# Patient Record
Sex: Female | Born: 1955 | Race: Asian | Hispanic: No | Smoking: Never smoker
Health system: Southern US, Community
[De-identification: ages and names within clinical notes are randomized; demographics above are authoritative.]

## PROBLEM LIST (undated history)

## (undated) DIAGNOSIS — E119 Type 2 diabetes mellitus without complications: Secondary | ICD-10-CM

## (undated) DIAGNOSIS — R040 Epistaxis: Secondary | ICD-10-CM

## (undated) DIAGNOSIS — E7800 Pure hypercholesterolemia, unspecified: Secondary | ICD-10-CM

## (undated) DIAGNOSIS — I10 Essential (primary) hypertension: Secondary | ICD-10-CM

---

## 2003-08-02 ENCOUNTER — Emergency Department (HOSPITAL_COMMUNITY): Admission: EM | Admit: 2003-08-02 | Discharge: 2003-08-02 | Payer: Self-pay | Admitting: *Deleted

## 2003-08-05 ENCOUNTER — Emergency Department (HOSPITAL_COMMUNITY): Admission: EM | Admit: 2003-08-05 | Discharge: 2003-08-05 | Payer: Self-pay | Admitting: Emergency Medicine

## 2007-03-25 ENCOUNTER — Encounter: Admission: RE | Admit: 2007-03-25 | Discharge: 2007-03-25 | Payer: Self-pay | Admitting: Obstetrics and Gynecology

## 2007-10-07 ENCOUNTER — Encounter: Admission: RE | Admit: 2007-10-07 | Discharge: 2007-10-07 | Payer: Self-pay | Admitting: Gastroenterology

## 2009-09-12 ENCOUNTER — Encounter: Admission: RE | Admit: 2009-09-12 | Discharge: 2009-09-12 | Payer: Self-pay | Admitting: Family Medicine

## 2009-09-12 IMAGING — MG MM DIGITAL SCREENING
4 series · 4 of 4 positions shown · non-contrast
Comparison: none

DG SCREEN MAMMOGRAM BILATERAL
Bilateral CC and MLO view(s) were taken.

DIGITAL SCREENING MAMMOGRAM WITH CAD:
There are scattered fibroglandular densities.  No masses or malignant type calcifications are 
identified.  Compared with prior studies.
Images were processed with CAD.

[R CC]
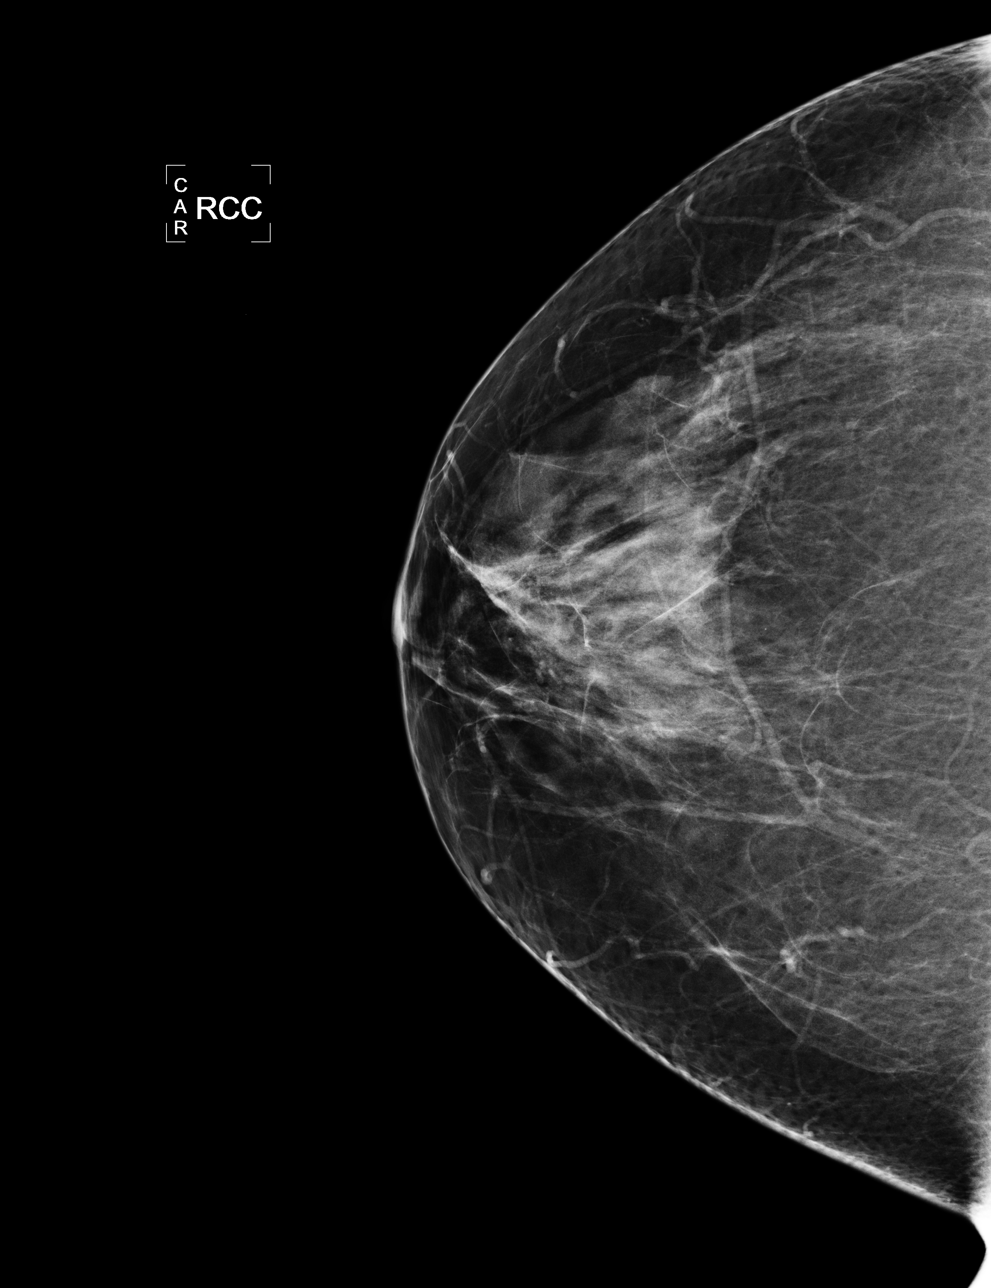

[L CC]
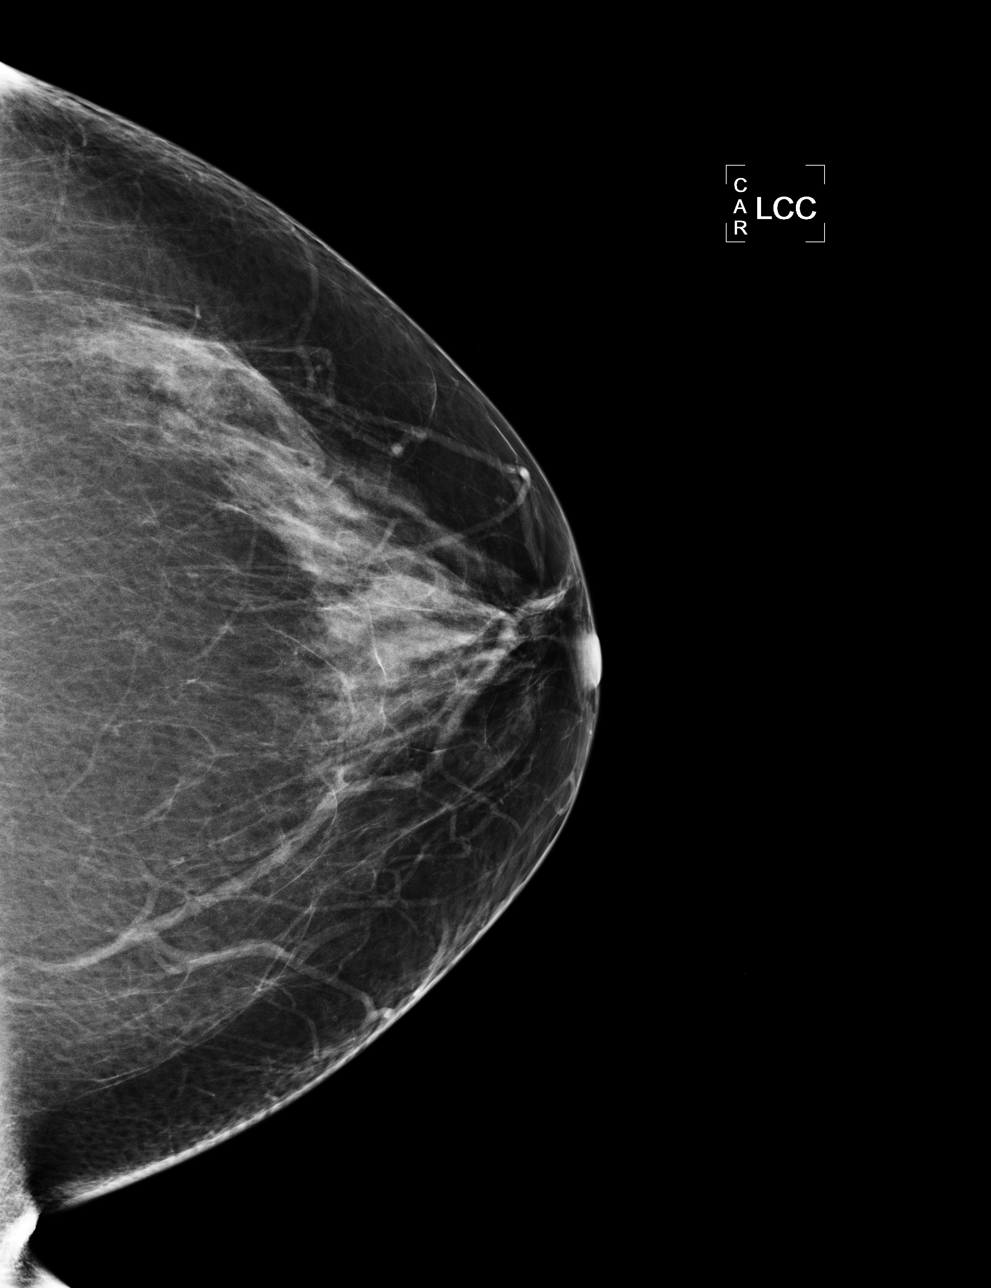

[L MLO]
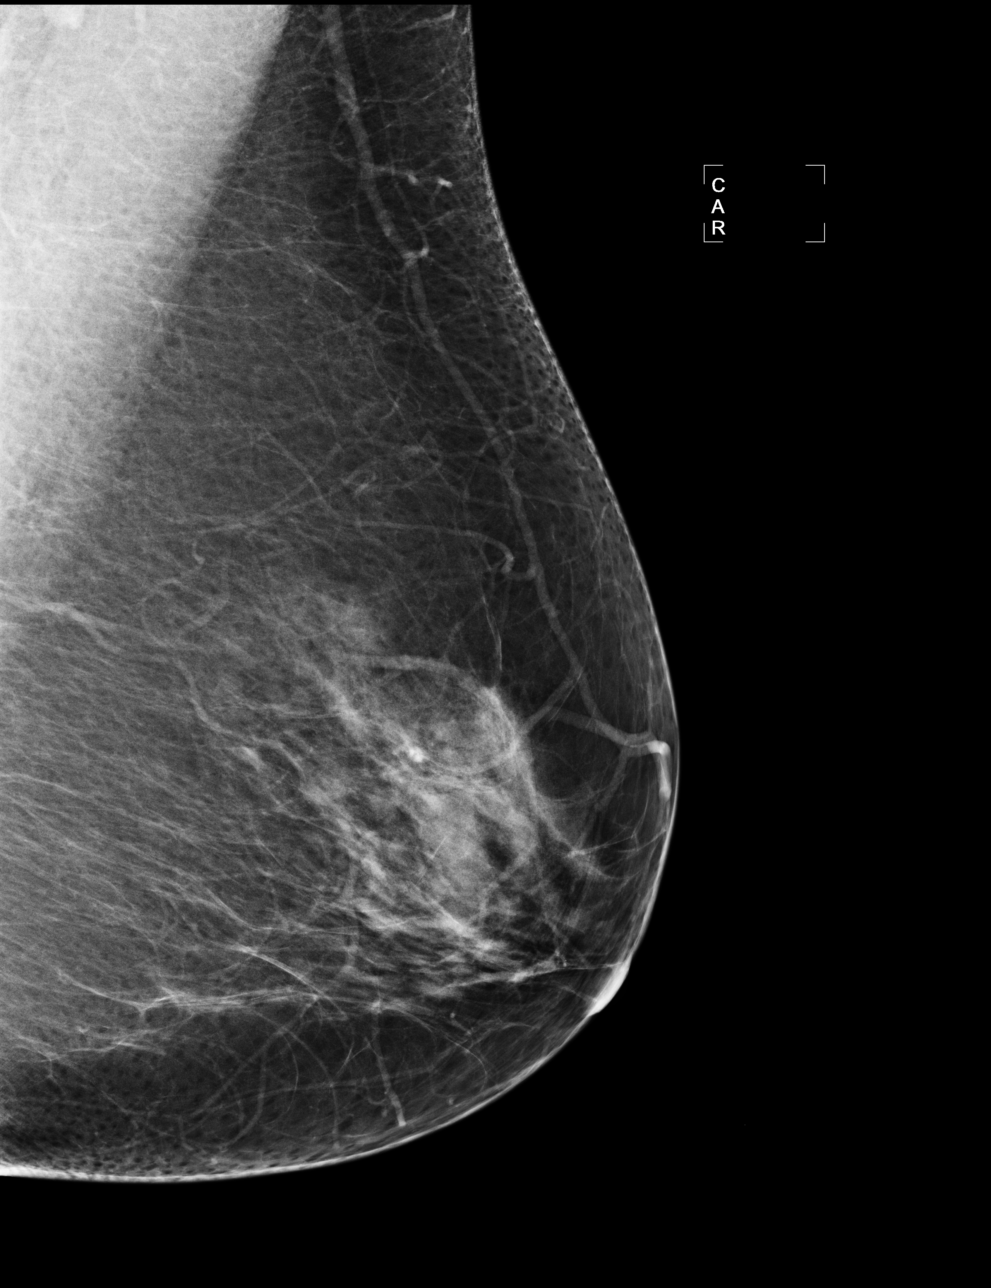

[R MLO]
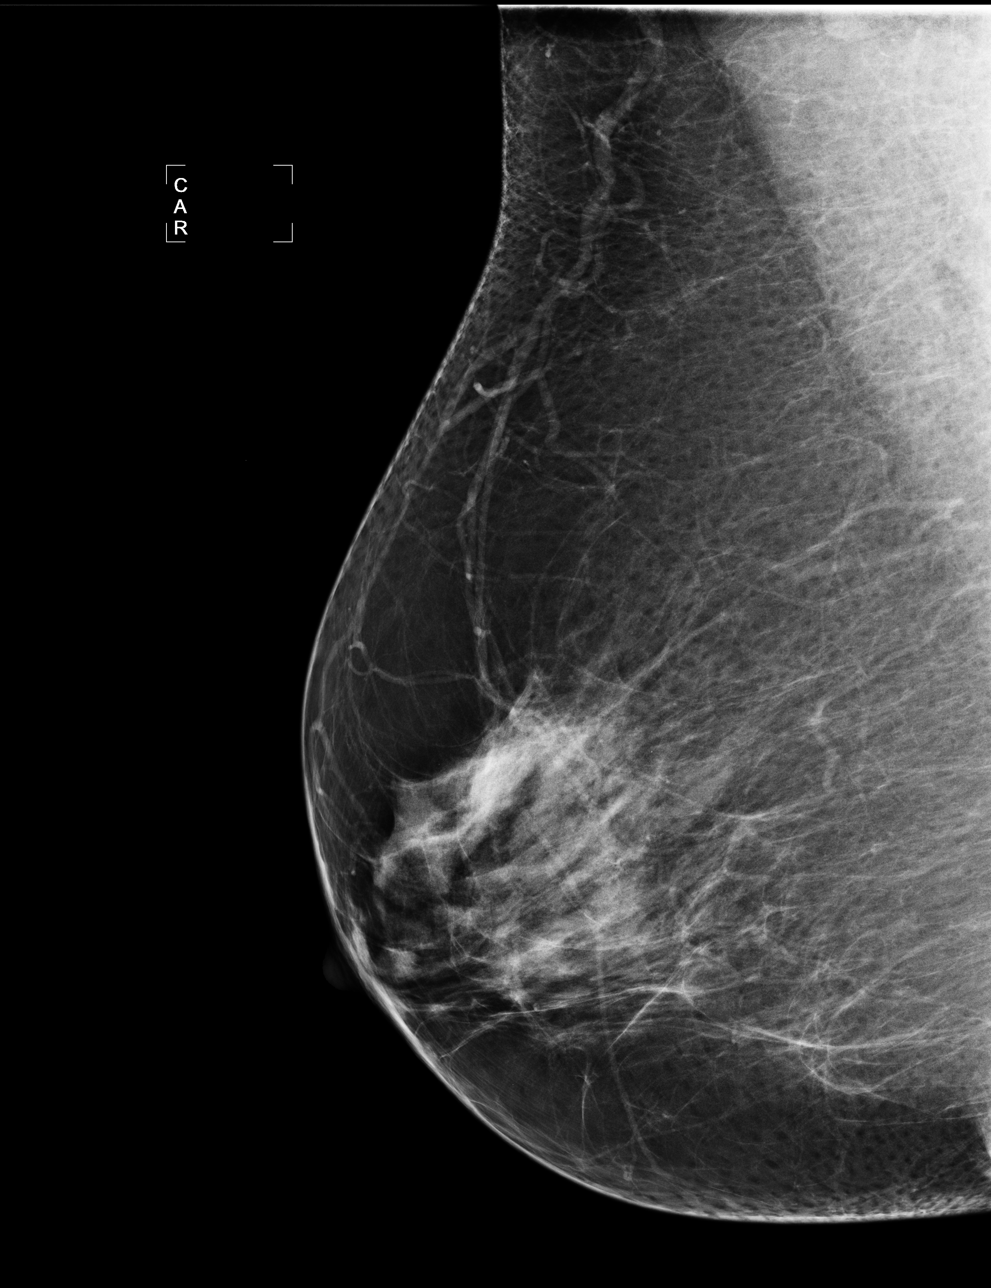

[4 of 4 positions shown; findings below may reference images not displayed]

IMPRESSION: No specific mammographic evidence of malignancy.  Next screening mammogram is recommended in one 
year.

A result letter of this screening mammogram will be mailed directly to the patient.

ASSESSMENT: Negative - BI-RADS 1

Screening mammogram in 1 year.
,

## 2017-07-30 ENCOUNTER — Encounter (HOSPITAL_BASED_OUTPATIENT_CLINIC_OR_DEPARTMENT_OTHER): Payer: Self-pay

## 2017-07-30 ENCOUNTER — Other Ambulatory Visit: Payer: Self-pay

## 2017-07-30 ENCOUNTER — Emergency Department (HOSPITAL_BASED_OUTPATIENT_CLINIC_OR_DEPARTMENT_OTHER)
Admission: EM | Admit: 2017-07-30 | Discharge: 2017-07-30 | Disposition: A | Discharge status: Home / Self Care | Payer: BLUE CROSS/BLUE SHIELD | Attending: Physician Assistant | Admitting: Physician Assistant

## 2017-07-30 DIAGNOSIS — I10 Essential (primary) hypertension: Secondary | ICD-10-CM | POA: Diagnosis not present

## 2017-07-30 DIAGNOSIS — R040 Epistaxis: Secondary | ICD-10-CM | POA: Insufficient documentation

## 2017-07-30 DIAGNOSIS — Z7982 Long term (current) use of aspirin: Secondary | ICD-10-CM | POA: Insufficient documentation

## 2017-07-30 DIAGNOSIS — E119 Type 2 diabetes mellitus without complications: Secondary | ICD-10-CM | POA: Diagnosis not present

## 2017-07-30 HISTORY — DX: Essential (primary) hypertension: I10

## 2017-07-30 HISTORY — DX: Type 2 diabetes mellitus without complications: E11.9

## 2017-07-30 HISTORY — DX: Epistaxis: R04.0

## 2017-07-30 HISTORY — DX: Pure hypercholesterolemia, unspecified: E78.00

## 2017-07-30 MED ORDER — OXYMETAZOLINE HCL 0.05 % NA SOLN
2.0000 | Freq: Once | NASAL | Status: AC
  Administered 2017-07-30: 2 via NASAL
  Filled 2017-07-30: qty 15

## 2017-07-30 NOTE — Discharge Instructions (Signed)
Please use Afrin and a cotton ball in the nose that bleeds again.

## 2017-07-30 NOTE — ED Notes (Signed)
Cotton ball soaked in aftrin and placed in left nare and nose clamp placed. Instructions from ENT printed off and given to son for future reference.

## 2017-07-30 NOTE — ED Triage Notes (Signed)
Per son in law/interpreter pt with freq nosebleeds-was sen by ENT today for f/u-pt with nosebleed after visit-pt with minimal nose bleed at this time-NAD-steady gait

## 2017-07-30 NOTE — ED Notes (Signed)
Nose clamps removed and afrin soaked gauze removed. No active bleeding noted.

## 2017-07-30 NOTE — ED Provider Notes (Signed)
[facility] EMERGENCY DEPARTMENT Provider Note   CSN: [id number] Arrival date & time: 07/30/17  1841     History   Chief Complaint Chief Complaint  Patient presents with  . Epistaxis    HPI Lori House is a 61 y.o. female.  HPI   62 year old female presenting after procedure today.  She patient had a procedure ENT and had some bleeding her nose.  Had not follow directions that ENT sent her home with with to use Afrin and a cotton ball.  Past Medical History:  Diagnosis Date  . Diabetes mellitus without complication (HCC)   . High cholesterol   . Hypertension   . Nosebleed     There are no active problems to display for this patient.   History reviewed. No pertinent surgical history.  OB History    No data available       Home Medications    Prior to Admission medications   Medication Sig Start Date End Date Taking? Authorizing Provider  aspirin 81 MG chewable tablet Chew by mouth daily.   Yes [provider]  UNKNOWN TO PATIENT HTN, High choles meds   Yes [provider]    Family History No family history on file.  Social History Social History   Tobacco Use  . Smoking status: Never Smoker  . Smokeless tobacco: Never Used  Substance Use Topics  . Alcohol use: No    Frequency: Never  . Drug use: No     Allergies   Patient has no known allergies.   Review of Systems Review of Systems  HENT: Positive for nosebleeds.      Physical Exam Updated Vital Signs BP (!) 153/82 (BP Location: Left Arm)   Pulse 74   Temp 98.6 F (37 C) (Oral)   Resp 18   Ht 5\' 2"  (1.575 m)   Wt 72.1 kg (159 lb)   SpO2 95%   BMI 29.08 kg/m   Physical Exam  Constitutional: She is oriented to person, place, and time. She appears well-developed and well-nourished.  HENT:  Head: Normocephalic and atraumatic.  Nares are clear.  No bleeding.  Eyes: Right eye exhibits no discharge. Left eye exhibits no discharge.  Cardiovascular: Normal  rate.  Abdominal: She exhibits no distension.  Neurological: She is oriented to person, place, and time.  Skin: Skin is warm and dry. She is not diaphoretic.  Psychiatric: She has a normal mood and affect.  Nursing note and vitals reviewed.    ED Treatments / Results  Labs (all labs ordered are listed, but only abnormal results are displayed) Labs Reviewed - No data to display  EKG  EKG Interpretation None       Radiology No results found.  Procedures Procedures (including critical care time)  Medications Ordered in ED Medications  oxymetazoline (AFRIN) 0.05 % nasal spray 2 spray (2 sprays Each Nare Given 07/30/17 2039)     Initial Impression / Assessment and Plan / ED Course  I have reviewed the triage vital signs and the nursing notes.  Pertinent labs & imaging results that were available during my care of the patient were reviewed by me and considered in my medical decision making (see chart for details).     Cottonball with Afrin placed in nare.  Bleeding completely stopped.  Cotton bawl removed.  No further bleeding.  Will have her follow-up with her ENT.  Final Clinical Impressions(s) / ED Diagnoses   Final diagnoses:  Epistaxis    ED  Discharge Orders    None       Abelino DerrickMackuen, Courteney Lyn, MD 07/30/17 2210

## 2021-10-09 ENCOUNTER — Other Ambulatory Visit: Payer: Self-pay

## 2021-10-09 ENCOUNTER — Emergency Department (HOSPITAL_BASED_OUTPATIENT_CLINIC_OR_DEPARTMENT_OTHER): Payer: Medicare Other

## 2021-10-09 ENCOUNTER — Encounter (HOSPITAL_BASED_OUTPATIENT_CLINIC_OR_DEPARTMENT_OTHER): Payer: Self-pay | Admitting: Emergency Medicine

## 2021-10-09 DIAGNOSIS — I10 Essential (primary) hypertension: Secondary | ICD-10-CM | POA: Diagnosis not present

## 2021-10-09 DIAGNOSIS — R110 Nausea: Secondary | ICD-10-CM | POA: Diagnosis not present

## 2021-10-09 DIAGNOSIS — R1013 Epigastric pain: Secondary | ICD-10-CM | POA: Insufficient documentation

## 2021-10-09 DIAGNOSIS — E1165 Type 2 diabetes mellitus with hyperglycemia: Secondary | ICD-10-CM | POA: Insufficient documentation

## 2021-10-09 DIAGNOSIS — R072 Precordial pain: Secondary | ICD-10-CM | POA: Insufficient documentation

## 2021-10-09 DIAGNOSIS — Z7982 Long term (current) use of aspirin: Secondary | ICD-10-CM | POA: Insufficient documentation

## 2021-10-09 LAB — COMPREHENSIVE METABOLIC PANEL
ALT: 23 U/L (ref 0–44)
AST: 27 U/L (ref 15–41)
Albumin: 4.1 g/dL (ref 3.5–5.0)
Alkaline Phosphatase: 70 U/L (ref 38–126)
Anion gap: 7 (ref 5–15)
BUN: 10 mg/dL (ref 8–23)
CO2: 25 mmol/L (ref 22–32)
Calcium: 8.8 mg/dL — ABNORMAL LOW (ref 8.9–10.3)
Chloride: 104 mmol/L (ref 98–111)
Creatinine, Ser: 0.61 mg/dL (ref 0.44–1.00)
GFR, Estimated: >60 mL/min (ref >60)
Glucose, Bld: 129 mg/dL — ABNORMAL HIGH (ref 70–99)
Potassium: 4 mmol/L (ref 3.5–5.1)
Sodium: 136 mmol/L (ref 135–145)
Total Bilirubin: 0.4 mg/dL (ref 0.3–1.2)
Total Protein: 7.7 g/dL (ref 6.5–8.1)

## 2021-10-09 LAB — CBC WITH DIFFERENTIAL/PLATELET
Abs Immature Granulocytes: 0.01 10*3/uL (ref 0.00–0.07)
Basophils Absolute: 0.1 10*3/uL (ref 0.0–0.1)
Basophils Relative: 1 %
Eosinophils Absolute: 0.4 10*3/uL (ref 0.0–0.5)
Eosinophils Relative: 6 %
HCT: 40.3 % (ref 36.0–46.0)
Hemoglobin: 13.5 g/dL (ref 12.0–15.0)
Immature Granulocytes: 0 %
Lymphocytes Relative: 28 %
Lymphs Abs: 1.7 10*3/uL (ref 0.7–4.0)
MCH: 32.1 pg (ref 26.0–34.0)
MCHC: 33.5 g/dL (ref 30.0–36.0)
MCV: 96 fL (ref 80.0–100.0)
Monocytes Absolute: 0.6 10*3/uL (ref 0.1–1.0)
Monocytes Relative: 10 %
Neutro Abs: 3.4 10*3/uL (ref 1.7–7.7)
Neutrophils Relative %: 55 %
Platelets: 174 10*3/uL (ref 150–400)
RBC: 4.2 MIL/uL (ref 3.87–5.11)
RDW: 13.2 % (ref 11.5–15.5)
WBC: 6.1 10*3/uL (ref 4.0–10.5)
nRBC: 0 % (ref 0.0–0.2)

## 2021-10-09 LAB — TROPONIN I (HIGH SENSITIVITY): Troponin I (High Sensitivity): 3 ng/L (ref <18)

## 2021-10-09 NOTE — ED Triage Notes (Addendum)
Chest pain and vomiting x 4 over past few weeks. She saw her pcp last Monday and was prescribed lansoprazole 30 mg. It has not improved her symptoms. Pain after eating. She is rubbing her chest in triage.  ?

## 2021-10-10 ENCOUNTER — Emergency Department (HOSPITAL_BASED_OUTPATIENT_CLINIC_OR_DEPARTMENT_OTHER)
Admission: EM | Admit: 2021-10-10 | Discharge: 2021-10-10 | Disposition: A | Discharge status: Home / Self Care | Payer: Medicare Other | Attending: Emergency Medicine | Admitting: Emergency Medicine

## 2021-10-10 DIAGNOSIS — R0789 Other chest pain: Secondary | ICD-10-CM

## 2021-10-10 DIAGNOSIS — R072 Precordial pain: Secondary | ICD-10-CM | POA: Diagnosis not present

## 2021-10-10 LAB — TROPONIN I (HIGH SENSITIVITY): Troponin I (High Sensitivity): 4 ng/L (ref <18)

## 2021-10-10 NOTE — ED Notes (Signed)
Patient discharged to home.  All discharge instructions reviewed.  Patient verbalized understanding via teachback method.  VS WDL.  Respirations even and unlabored.  Ambulatory out of ED.   °

## 2021-10-10 NOTE — ED Provider Notes (Signed)
?[facility] EMERGENCY DEPARTMENT ?Provider Note ? ?CSN: [id number] ?Arrival date & time: 10/09/21 2108 ? ?Chief Complaint(s) ?Chest Pain ? ?HPI ?Lori House is a 66 y.o. female   ? ? ?Chest Pain ?Pain location:  Substernal area and epigastric ?Pain quality: pressure   ?Pain radiates to:  Does not radiate ?Pain severity:  Moderate ?Onset quality:  Gradual ?Duration:  5 days ?Timing:  Constant ?Progression:  Resolved (after being placed in treatment room.) ?Chronicity:  Recurrent (has been recurrent for 2 weeks. started taking antiacid 5 days ago.) ?Context comment:  Only happens at night while going to bed. ?Relieved by:  Nothing ?Worsened by:  Nothing ?Associated symptoms: nausea   ?Associated symptoms: no abdominal pain, no cough, no fever, no shortness of breath and no vomiting   ?Risk factors: hypertension   ? ?Past Medical History ?Past Medical History:  ?Diagnosis Date  ? Diabetes mellitus without complication (HCC)   ? High cholesterol   ? Hypertension   ? Nosebleed   ? ?There are no problems to display for this patient. ? ?Home Medication(s) ?Prior to Admission medications   ?Medication Sig Start Date End Date Taking? Authorizing Provider  ?aspirin 81 MG chewable tablet Chew by mouth daily.    [provider]  ?UNKNOWN TO PATIENT HTN, High choles meds    [provider]  ?                                                                                                                                  ?Allergies ?Patient has no known allergies. ? ?Review of Systems ?Review of Systems  ?Constitutional:  Negative for fever.  ?Respiratory:  Negative for cough and shortness of breath.   ?Cardiovascular:  Positive for chest pain.  ?Gastrointestinal:  Positive for nausea. Negative for abdominal pain and vomiting.  ?As noted in HPI ? ?Physical Exam ?Vital Signs  ?I have reviewed the triage vital signs ?BP (!) 144/69   Pulse 89   Temp 98.4 ?F (36.9 ?C) (Oral)   Resp 16   Ht 5\' 2"  (1.575 m)    Wt 72 kg   SpO2 98%   BMI 29.03 kg/m?  ? ?Physical Exam ?Vitals reviewed.  ?Constitutional:   ?   General: She is not in acute distress. ?   Appearance: She is well-developed. She is not diaphoretic.  ?HENT:  ?   Head: Normocephalic and atraumatic.  ?   Nose: Nose normal.  ?Eyes:  ?   General: No scleral icterus.    ?   Right eye: No discharge.     ?   Left eye: No discharge.  ?   Conjunctiva/sclera: Conjunctivae normal.  ?   Pupils: Pupils are equal, round, and reactive to light.  ?Cardiovascular:  ?   Rate and Rhythm: Normal rate and regular rhythm.  ?   Heart sounds: No murmur heard. ?  No friction rub. No gallop.  ?  Pulmonary:  ?   Effort: Pulmonary effort is normal. No respiratory distress.  ?   Breath sounds: Normal breath sounds. No stridor. No rales.  ?Abdominal:  ?   General: There is no distension.  ?   Palpations: Abdomen is soft.  ?   Tenderness: There is no abdominal tenderness.  ?Musculoskeletal:     ?   General: No tenderness.  ?   Cervical back: Normal range of motion and neck supple.  ?Skin: ?   General: Skin is warm and dry.  ?   Findings: No erythema or rash.  ?Neurological:  ?   Mental Status: She is alert and oriented to person, place, and time.  ? ? ?ED Results and Treatments ?Labs ?(all labs ordered are listed, but only abnormal results are displayed) ?Labs Reviewed  ?COMPREHENSIVE METABOLIC PANEL - Abnormal; Notable for the following components:  ?    Result Value  ? Glucose, Bld 129 (*)   ? Calcium 8.8 (*)   ? All other components within normal limits  ?CBC WITH DIFFERENTIAL/PLATELET  ?TROPONIN I (HIGH SENSITIVITY)  ?TROPONIN I (HIGH SENSITIVITY)  ?                                                                                                                       ?EKG ? EKG Interpretation ? ?Date/Time:  Tuesday October 09 2021 21:39:03 EDT ?Ventricular Rate:  70 ?PR Interval:  138 ?QRS Duration: 88 ?QT Interval:  390 ?QTC Calculation: 421 ?R Axis:   65 ?Text Interpretation: Normal sinus  rhythm Septal infarct , age undetermined Abnormal ECG No previous ECGs available Confirmed by Drema Pry [phone]) on 10/10/2021 12:40:46 AM ?  ? ?  ? ?Radiology ?DG Chest 2 View ? ?Result Date: 10/09/2021 ?CLINICAL DATA:  Chest pain and vomiting for several weeks EXAM: CHEST - 2 VIEW COMPARISON:  None. FINDINGS: The heart size and mediastinal contours are within normal limits. Both lungs are clear. The visualized skeletal structures are unremarkable. IMPRESSION: No active cardiopulmonary disease. Electronically Signed   By: Sharlet Salina M.D.   On: 10/09/2021 21:53   ? ?Pertinent labs & imaging results that were available during my care of the patient were reviewed by me and considered in my medical decision making (see MDM for details). ? ?Medications Ordered in ED ?Medications - No data to display                                                               ?                                                                    ?  Procedures ?Procedures ? ?(including critical care time) ? ?Medical Decision Making / ED Course ? ? ? Complexity of Problem: ? ?Co-morbidities/SDOH that complicate the patient evaluation/care: ?Hypertension.  Patient no longer being treated for diabetes. ? ?Additional history obtained: ?Daughter at bedside assisting with translation ? ?Patient's presenting problem/concern, DDX, and MDM listed below: ?Chest pain ?Features somewhat consistent with GERD/reflux. ?Given comorbidities and age, will rule out ACS.  Heart score less than 3.  Appropriate for delta trops ?Low suspicion for pulmonary embolism or aortic dissection. ?We will obtain x-rays to rule out pneumothorax, pneumonia. ? ?Hospitalization Considered:  ?Yes if ruled in for ACS ? ? ? ?  Complexity of Data: ?  ?Cardiac Monitoring: ?The patient was maintained on a cardiac monitor.   ?I personally viewed and interpreted the cardiac monitored which showed an underlying rhythm of normal sinus rhythm with rates in the 70s to  80s ? ?Laboratory Tests ordered listed below with my independent interpretation: ?CBC without leukocytosis or anemia ?No significant electrolyte derangements or renal sufficiency. ?Mild hyperglycemia without DKA. ?Serial troponins negative x2. ?  ?Imaging Studies ordered listed below with my independent interpretation: ?On my read of the chest x-ray, there was no evidence suggestive of pneumonia, pneumothorax, pneumomediastinum, pulmonary edema concerning for new or exacerbation of heart failure, abnormal contour of the mediastinum to suggest dissection, and no evidence of acute injuries. ?  ?  ?ED Course:   ? ?Assessment, Add'l Intervention, and Reassessment: ?Chest pain ?Most consistent with GERD/reflux ?Patient remained chest pain-free throughout her stay.  ?Recommended continued supportive management. ?PCP follow-up as needed. ? ? ?Final Clinical Impression(s) / ED Diagnoses ?Final diagnoses:  ?Chest discomfort  ? ?The patient appears reasonably screened and/or stabilized for discharge and I doubt any other medical condition or other [facility] requiring further screening, evaluation, or treatment in the ED at this time prior to discharge. Safe for discharge with strict return precautions. ? ?Disposition: Discharge ? ?Condition: Good ? ?I have discussed the results, Dx and Tx plan with the patient/family who expressed understanding and agree(s) with the plan. Discharge instructions discussed at length. The patient/family was given strict return precautions who verbalized understanding of the instructions. No further questions at time of discharge.  ? ? ?ED Discharge Orders   ? ? None  ? ?  ? ? ? ?Follow Up: ?Primary care provider ? ?Call  ?to schedule an appointment for close follow up ? ? ? ?  ? ? ? ? ? ?This chart was dictated using voice recognition software.  Despite best efforts to proofread,  errors can occur which can change the documentation meaning. ? ?  ?Nira Connardama, Jerie Basford Eduardo, MD ?10/10/21 0249 ? ?
